# Patient Record
Sex: Male | Born: 1960 | Race: Black or African American | Hispanic: No | Marital: Married | Smoking: Current every day smoker
Health system: Southern US, Community
[De-identification: ages and names within clinical notes are randomized; demographics above are authoritative.]

## PROBLEM LIST (undated history)

## (undated) DIAGNOSIS — F329 Major depressive disorder, single episode, unspecified: Secondary | ICD-10-CM

## (undated) DIAGNOSIS — F419 Anxiety disorder, unspecified: Secondary | ICD-10-CM

---

## 1980-04-21 HISTORY — PX: TONSILLECTOMY: SUR1361

## 2002-04-21 DIAGNOSIS — F32A Depression, unspecified: Secondary | ICD-10-CM

## 2002-04-21 DIAGNOSIS — F419 Anxiety disorder, unspecified: Secondary | ICD-10-CM

## 2002-04-21 HISTORY — DX: Depression, unspecified: F32.A

## 2002-04-21 HISTORY — DX: Anxiety disorder, unspecified: F41.9

## 2017-05-18 ENCOUNTER — Emergency Department (HOSPITAL_COMMUNITY): Payer: No Typology Code available for payment source

## 2017-05-18 ENCOUNTER — Other Ambulatory Visit: Payer: Self-pay

## 2017-05-18 ENCOUNTER — Encounter (HOSPITAL_COMMUNITY): Payer: Self-pay

## 2017-05-18 ENCOUNTER — Inpatient Hospital Stay (HOSPITAL_COMMUNITY)
Admission: EM | Admit: 2017-05-18 | Discharge: 2017-05-24 | DRG: 199 | Disposition: A | Discharge status: Home / Self Care | Payer: No Typology Code available for payment source | Attending: General Surgery | Admitting: General Surgery

## 2017-05-18 DIAGNOSIS — I959 Hypotension, unspecified: Secondary | ICD-10-CM | POA: Diagnosis present

## 2017-05-18 DIAGNOSIS — W260XXA Contact with knife, initial encounter: Secondary | ICD-10-CM | POA: Diagnosis present

## 2017-05-18 DIAGNOSIS — W458XXA Other foreign body or object entering through skin, initial encounter: Secondary | ICD-10-CM | POA: Diagnosis present

## 2017-05-18 DIAGNOSIS — S21312A Laceration without foreign body of left front wall of thorax with penetration into thoracic cavity, initial encounter: Secondary | ICD-10-CM | POA: Diagnosis present

## 2017-05-18 DIAGNOSIS — S21112A Laceration without foreign body of left front wall of thorax without penetration into thoracic cavity, initial encounter: Secondary | ICD-10-CM

## 2017-05-18 DIAGNOSIS — Z4682 Encounter for fitting and adjustment of non-vascular catheter: Secondary | ICD-10-CM

## 2017-05-18 DIAGNOSIS — F1410 Cocaine abuse, uncomplicated: Secondary | ICD-10-CM | POA: Diagnosis present

## 2017-05-18 DIAGNOSIS — J939 Pneumothorax, unspecified: Secondary | ICD-10-CM

## 2017-05-18 DIAGNOSIS — S270XXA Traumatic pneumothorax, initial encounter: Principal | ICD-10-CM | POA: Diagnosis present

## 2017-05-18 DIAGNOSIS — S27331A Laceration of lung, unilateral, initial encounter: Secondary | ICD-10-CM | POA: Diagnosis present

## 2017-05-18 DIAGNOSIS — F17200 Nicotine dependence, unspecified, uncomplicated: Secondary | ICD-10-CM | POA: Diagnosis present

## 2017-05-18 DIAGNOSIS — Z9689 Presence of other specified functional implants: Secondary | ICD-10-CM

## 2017-05-18 DIAGNOSIS — R079 Chest pain, unspecified: Secondary | ICD-10-CM

## 2017-05-18 HISTORY — DX: Anxiety disorder, unspecified: F41.9

## 2017-05-18 HISTORY — DX: Major depressive disorder, single episode, unspecified: F32.9

## 2017-05-18 LAB — CBC WITH DIFFERENTIAL/PLATELET
BASOS PCT: 0 %
Basophils Absolute: 0 10*3/uL (ref 0.0–0.1)
EOS ABS: 0.1 10*3/uL (ref 0.0–0.7)
Eosinophils Relative: 1 %
HCT: 45.7 % (ref 39.0–52.0)
HEMOGLOBIN: 14.5 g/dL (ref 13.0–17.0)
Lymphocytes Relative: 46 %
Lymphs Abs: 5 10*3/uL — ABNORMAL HIGH (ref 0.7–4.0)
MCH: 29.1 pg (ref 26.0–34.0)
MCHC: 31.7 g/dL (ref 30.0–36.0)
MCV: 91.8 fL (ref 78.0–100.0)
MONOS PCT: 8 %
Monocytes Absolute: 0.9 10*3/uL (ref 0.1–1.0)
NEUTROS PCT: 45 %
Neutro Abs: 5 10*3/uL (ref 1.7–7.7)
PLATELETS: 294 10*3/uL (ref 150–400)
RBC: 4.98 MIL/uL (ref 4.22–5.81)
RDW: 14.3 % (ref 11.5–15.5)
WBC: 11 10*3/uL — AB (ref 4.0–10.5)

## 2017-05-18 LAB — TYPE AND SCREEN
ABO/RH(D): O POS
Antibody Screen: NEGATIVE
UNIT DIVISION: 0
Unit division: 0

## 2017-05-18 LAB — PREPARE FRESH FROZEN PLASMA
UNIT DIVISION: 0
Unit division: 0

## 2017-05-18 LAB — COMPREHENSIVE METABOLIC PANEL
ALK PHOS: 56 U/L (ref 38–126)
ALT: 17 U/L (ref 17–63)
AST: 58 U/L — ABNORMAL HIGH (ref 15–41)
Albumin: 4.4 g/dL (ref 3.5–5.0)
BUN: 14 mg/dL (ref 6–20)
CALCIUM: 9.6 mg/dL (ref 8.9–10.3)
CO2: <7 mmol/L — ABNORMAL LOW (ref 22–32)
Chloride: 99 mmol/L — ABNORMAL LOW (ref 101–111)
Creatinine, Ser: 1.87 mg/dL — ABNORMAL HIGH (ref 0.61–1.24)
GFR calc non Af Amer: 38 mL/min — ABNORMAL LOW (ref >60)
GFR, EST AFRICAN AMERICAN: 44 mL/min — AB (ref >60)
Glucose, Bld: 205 mg/dL — ABNORMAL HIGH (ref 65–99)
Potassium: 3.7 mmol/L (ref 3.5–5.1)
Sodium: 139 mmol/L (ref 135–145)
TOTAL PROTEIN: 7.9 g/dL (ref 6.5–8.1)
Total Bilirubin: 1.2 mg/dL (ref 0.3–1.2)

## 2017-05-18 LAB — BPAM FFP
BLOOD PRODUCT EXPIRATION DATE: 201902172359
BLOOD PRODUCT EXPIRATION DATE: 201902172359
ISSUE DATE / TIME: 201901281056
ISSUE DATE / TIME: 201901281056
UNIT TYPE AND RH: 6200
Unit Type and Rh: 6200

## 2017-05-18 LAB — URINALYSIS, ROUTINE W REFLEX MICROSCOPIC
Bacteria, UA: NONE SEEN
Bilirubin Urine: NEGATIVE
GLUCOSE, UA: 50 mg/dL — AB
Ketones, ur: 5 mg/dL — AB
LEUKOCYTES UA: NEGATIVE
NITRITE: NEGATIVE
PH: 6 (ref 5.0–8.0)
Protein, ur: NEGATIVE mg/dL
SQUAMOUS EPITHELIAL / LPF: NONE SEEN
WBC, UA: NONE SEEN WBC/hpf (ref 0–5)

## 2017-05-18 LAB — MRSA PCR SCREENING: MRSA BY PCR: NEGATIVE

## 2017-05-18 LAB — I-STAT CHEM 8, ED
BUN: 15 mg/dL (ref 6–20)
CALCIUM ION: 1.09 mmol/L — AB (ref 1.15–1.40)
CHLORIDE: 103 mmol/L (ref 101–111)
Creatinine, Ser: 1.3 mg/dL — ABNORMAL HIGH (ref 0.61–1.24)
Glucose, Bld: 188 mg/dL — ABNORMAL HIGH (ref 65–99)
HCT: 47 % (ref 39.0–52.0)
Hemoglobin: 16 g/dL (ref 13.0–17.0)
Potassium: 3.7 mmol/L (ref 3.5–5.1)
Sodium: 140 mmol/L (ref 135–145)
TCO2: 9 mmol/L — ABNORMAL LOW (ref 22–32)

## 2017-05-18 LAB — RAPID URINE DRUG SCREEN, HOSP PERFORMED
AMPHETAMINES: NOT DETECTED
BARBITURATES: NOT DETECTED
BENZODIAZEPINES: NOT DETECTED
Cocaine: POSITIVE — AB
Opiates: POSITIVE — AB
Tetrahydrocannabinol: NOT DETECTED

## 2017-05-18 LAB — I-STAT TROPONIN, ED: Troponin i, poc: 0.01 ng/mL (ref 0.00–0.08)

## 2017-05-18 LAB — ETHANOL: Alcohol, Ethyl (B): <10 mg/dL (ref <10)

## 2017-05-18 LAB — I-STAT VENOUS BLOOD GAS, ED
Bicarbonate: 27.5 mmol/L (ref 20.0–28.0)
O2 Saturation: 68 %
PCO2 VEN: 54.8 mmHg (ref 44.0–60.0)
PH VEN: 7.308 (ref 7.250–7.430)
TCO2: 29 mmol/L (ref 22–32)
pO2, Ven: 39 mmHg (ref 32.0–45.0)

## 2017-05-18 LAB — BPAM RBC
BLOOD PRODUCT EXPIRATION DATE: 201902012359
Blood Product Expiration Date: 201902032359
ISSUE DATE / TIME: 201901281055
ISSUE DATE / TIME: 201901281055
UNIT TYPE AND RH: 9500
Unit Type and Rh: 9500

## 2017-05-18 LAB — ABO/RH: ABO/RH(D): O POS

## 2017-05-18 MED ORDER — ONDANSETRON HCL 4 MG/2ML IJ SOLN: 4.0000 mg | Freq: Four times a day (QID) | INTRAMUSCULAR | Status: DC | PRN

## 2017-05-18 MED ORDER — FENTANYL CITRATE (PF) 100 MCG/2ML IJ SOLN
INTRAMUSCULAR | Status: AC
  Filled 2017-05-18: qty 2

## 2017-05-18 MED ORDER — IOPAMIDOL (ISOVUE-370) INJECTION 76%
INTRAVENOUS | Status: AC
  Filled 2017-05-18: qty 100

## 2017-05-18 MED ORDER — VITAMIN B-1 100 MG PO TABS
100.0000 mg | ORAL_TABLET | Freq: Every day | ORAL | Status: DC
  Administered 2017-05-18 – 2017-05-23 (×6): 100 mg via ORAL
  Filled 2017-05-18 (×6): qty 1

## 2017-05-18 MED ORDER — METHOCARBAMOL 1000 MG/10ML IJ SOLN
500.0000 mg | Freq: Three times a day (TID) | INTRAVENOUS | Status: DC
  Administered 2017-05-18 – 2017-05-21 (×9): 500 mg via INTRAVENOUS
  Filled 2017-05-18 (×10): qty 5
  Filled 2017-05-18 (×3): qty 550

## 2017-05-18 MED ORDER — FOLIC ACID 1 MG PO TABS
1.0000 mg | ORAL_TABLET | Freq: Every day | ORAL | Status: DC
  Administered 2017-05-18 – 2017-05-23 (×6): 1 mg via ORAL
  Filled 2017-05-18 (×6): qty 1

## 2017-05-18 MED ORDER — HYDROMORPHONE HCL 1 MG/ML IJ SOLN
0.5000 mg | INTRAMUSCULAR | Status: DC | PRN
  Administered 2017-05-18 – 2017-05-19 (×4): 1 mg via INTRAVENOUS
  Administered 2017-05-19: 0.5 mg via INTRAVENOUS
  Administered 2017-05-19 – 2017-05-21 (×12): 1 mg via INTRAVENOUS
  Filled 2017-05-18 (×16): qty 1
  Filled 2017-05-18: qty 0.5
  Filled 2017-05-18: qty 1

## 2017-05-18 MED ORDER — SODIUM CHLORIDE 0.9 % IV SOLN
INTRAVENOUS | Status: AC | PRN
  Administered 2017-05-18: 1000 mL via INTRAVENOUS

## 2017-05-18 MED ORDER — ADULT MULTIVITAMIN W/MINERALS CH
1.0000 | ORAL_TABLET | Freq: Every day | ORAL | Status: DC
  Administered 2017-05-18 – 2017-05-23 (×6): 1 via ORAL
  Filled 2017-05-18 (×6): qty 1

## 2017-05-18 MED ORDER — PANTOPRAZOLE SODIUM 40 MG PO TBEC
40.0000 mg | DELAYED_RELEASE_TABLET | Freq: Every day | ORAL | Status: DC
  Administered 2017-05-18 – 2017-05-23 (×6): 40 mg via ORAL
  Filled 2017-05-18 (×6): qty 1

## 2017-05-18 MED ORDER — TETANUS-DIPHTH-ACELL PERTUSSIS 5-2.5-18.5 LF-MCG/0.5 IM SUSP
0.5000 mL | Freq: Once | INTRAMUSCULAR | Status: AC
  Administered 2017-05-18: 0.5 mL via INTRAMUSCULAR
  Filled 2017-05-18: qty 0.5

## 2017-05-18 MED ORDER — KCL IN DEXTROSE-NACL 20-5-0.45 MEQ/L-%-% IV SOLN
INTRAVENOUS | Status: DC
  Administered 2017-05-18 – 2017-05-21 (×5): via INTRAVENOUS
  Filled 2017-05-18 (×6): qty 1000

## 2017-05-18 MED ORDER — LORAZEPAM 1 MG PO TABS: 1.0000 mg | ORAL_TABLET | Freq: Four times a day (QID) | ORAL | Status: AC | PRN

## 2017-05-18 MED ORDER — LORAZEPAM 2 MG/ML IJ SOLN
0.0000 mg | Freq: Four times a day (QID) | INTRAMUSCULAR | Status: DC
  Administered 2017-05-19: 2 mg via INTRAVENOUS
  Filled 2017-05-18: qty 1

## 2017-05-18 MED ORDER — LORAZEPAM 2 MG/ML IJ SOLN: 1.0000 mg | Freq: Four times a day (QID) | INTRAMUSCULAR | Status: AC | PRN

## 2017-05-18 MED ORDER — ONDANSETRON 4 MG PO TBDP: 4.0000 mg | ORAL_TABLET | Freq: Four times a day (QID) | ORAL | Status: DC | PRN

## 2017-05-18 MED ORDER — PANTOPRAZOLE SODIUM 40 MG IV SOLR: 40.0000 mg | Freq: Every day | INTRAVENOUS | Status: DC

## 2017-05-18 MED ORDER — KETOROLAC TROMETHAMINE 30 MG/ML IJ SOLN
30.0000 mg | Freq: Once | INTRAMUSCULAR | Status: AC
  Administered 2017-05-18: 30 mg via INTRAVENOUS
  Filled 2017-05-18: qty 1

## 2017-05-18 MED ORDER — HYDROMORPHONE HCL 1 MG/ML IJ SOLN
0.5000 mg | INTRAMUSCULAR | Status: DC | PRN
  Administered 2017-05-18: 1 mg via INTRAVENOUS
  Filled 2017-05-18: qty 1

## 2017-05-18 MED ORDER — ENOXAPARIN SODIUM 40 MG/0.4ML ~~LOC~~ SOLN
40.0000 mg | SUBCUTANEOUS | Status: DC
  Administered 2017-05-19 – 2017-05-21 (×3): 40 mg via SUBCUTANEOUS
  Filled 2017-05-18 (×5): qty 0.4

## 2017-05-18 MED ORDER — THIAMINE HCL 100 MG/ML IJ SOLN: 100.0000 mg | Freq: Every day | INTRAMUSCULAR | Status: DC

## 2017-05-18 MED ORDER — LORAZEPAM 2 MG/ML IJ SOLN: 0.0000 mg | Freq: Two times a day (BID) | INTRAMUSCULAR | Status: DC

## 2017-05-18 MED ORDER — FENTANYL CITRATE (PF) 100 MCG/2ML IJ SOLN
INTRAMUSCULAR | Status: AC | PRN
  Administered 2017-05-18: 50 ug via INTRAVENOUS

## 2017-05-18 MED ORDER — IPRATROPIUM-ALBUTEROL 0.5-2.5 (3) MG/3ML IN SOLN: 3.0000 mL | RESPIRATORY_TRACT | Status: DC | PRN

## 2017-05-18 MED ORDER — ACETAMINOPHEN 500 MG PO TABS
1000.0000 mg | ORAL_TABLET | Freq: Three times a day (TID) | ORAL | Status: DC
  Administered 2017-05-18 – 2017-05-24 (×19): 1000 mg via ORAL
  Filled 2017-05-18 (×20): qty 2

## 2017-05-18 MED ORDER — HYDRALAZINE HCL 20 MG/ML IJ SOLN: 10.0000 mg | INTRAMUSCULAR | Status: DC | PRN

## 2017-05-18 MED ORDER — OXYCODONE HCL 5 MG PO TABS
5.0000 mg | ORAL_TABLET | ORAL | Status: DC | PRN
  Administered 2017-05-18: 5 mg via ORAL
  Administered 2017-05-19 – 2017-05-20 (×5): 10 mg via ORAL
  Filled 2017-05-18: qty 1
  Filled 2017-05-18 (×5): qty 2

## 2017-05-18 MED ORDER — DOCUSATE SODIUM 100 MG PO CAPS
100.0000 mg | ORAL_CAPSULE | Freq: Two times a day (BID) | ORAL | Status: DC
  Administered 2017-05-18 – 2017-05-23 (×10): 100 mg via ORAL
  Filled 2017-05-18 (×10): qty 1

## 2017-05-18 NOTE — ED Notes (Signed)
PA at the bedside.

## 2017-05-18 NOTE — Progress Notes (Signed)
Visited with this patient as a referral from previous Chaplain.  Patient is crying and talking about being tired of certain situations he faces in life.  Patient asked for prayer.  Compassionate listening and presence provided.  Patient and I prayed together.  Will continue to follow as needed.  Please place a consult or page chaplain as needed.    05/18/17 1242  Clinical Encounter Type  Visited With Patient;Health care provider  Visit Type Follow-up;Psychological support;Spiritual support;Trauma  Spiritual Encounters  Spiritual Needs Prayer;Emotional  Stress Factors  Patient Stress Factors Exhausted
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
## 2017-05-18 NOTE — ED Notes (Signed)
MD Janee Mornhompson at the bedside placing Chest Tube

## 2017-05-18 NOTE — Progress Notes (Signed)
1555 Pt to floor via stretcher. A&Ox4, moaning in pain. Chest tube to left anterior chest, draining bloody fluid. Pt assessed, oriented to room, VSS. Dr. Lindie SpruceWyatt at bedside, new orders received for pain meds. See MAR. WCTM.   1800 Pt resting comfortably, NAD, VSS, updated mom via phone. Password set up. WCTM.
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
## 2017-05-18 NOTE — Procedures (Signed)
Chest Tube Insertion Procedure Note  Indications:  L pneumothorax  Pre-operative Diagnosis: L Pneumothorax  Post-operative Diagnosis: L Pneumothorax   Surgeon: Violeta GelinasBurke Anessia Oakland, MD  Assist: Raphael GibneyZach Shultz  Procedure Details  Informed consent was obtained for the procedure, including sedation.  Risks of lung perforation, hemorrhage, arrhythmia, and adverse drug reaction were discussed.   After sterile skin prep, using standard technique, a 14 French tube was placed in the left anterior axillary line above the nipple level using Seldinger technique. Tube was attached to a Pleurevaqc. Starile dressing placed.  Findings: Rush of air  Estimated Blood Loss:  Minimal         Specimens:  None              Complications:  None; patient tolerated the procedure well.         Disposition: trauma bay         Condition: stable  Violeta GelinasBurke Modene Andy, MD, MPH, FACS Trauma: [phone] General Surgery: [phone]
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
## 2017-05-18 NOTE — ED Notes (Signed)
Pt taken to CT.
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
## 2017-05-19 ENCOUNTER — Inpatient Hospital Stay (HOSPITAL_COMMUNITY): Payer: No Typology Code available for payment source

## 2017-05-19 LAB — BASIC METABOLIC PANEL
ANION GAP: 9 (ref 5–15)
BUN: 9 mg/dL (ref 6–20)
CALCIUM: 8.9 mg/dL (ref 8.9–10.3)
CO2: 26 mmol/L (ref 22–32)
Chloride: 104 mmol/L (ref 101–111)
Creatinine, Ser: 1 mg/dL (ref 0.61–1.24)
GFR calc Af Amer: >60 mL/min (ref >60)
GFR calc non Af Amer: >60 mL/min (ref >60)
GLUCOSE: 113 mg/dL — AB (ref 65–99)
Potassium: 3.6 mmol/L (ref 3.5–5.1)
Sodium: 139 mmol/L (ref 135–145)

## 2017-05-19 LAB — CBC
HEMATOCRIT: 38.5 % — AB (ref 39.0–52.0)
Hemoglobin: 12.6 g/dL — ABNORMAL LOW (ref 13.0–17.0)
MCH: 28.3 pg (ref 26.0–34.0)
MCHC: 32.7 g/dL (ref 30.0–36.0)
MCV: 86.5 fL (ref 78.0–100.0)
Platelets: 200 10*3/uL (ref 150–400)
RBC: 4.45 MIL/uL (ref 4.22–5.81)
RDW: 14.4 % (ref 11.5–15.5)
WBC: 5.7 10*3/uL (ref 4.0–10.5)

## 2017-05-19 LAB — BLOOD PRODUCT ORDER (VERBAL) VERIFICATION
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
## 2017-05-19 NOTE — Progress Notes (Signed)
Patient arrived to 6n31 slightly drowsy but oriented. IV fluids infusing, scds on, placed on cont pulse ox and telemetry. Patient has chest tube that is hooked to wall suction. Oriented to room and staff. Will continue to monitor.

## 2017-05-20 ENCOUNTER — Inpatient Hospital Stay (HOSPITAL_COMMUNITY): Payer: No Typology Code available for payment source

## 2017-05-20 LAB — CBC WITH DIFFERENTIAL/PLATELET
BASOS ABS: 0 10*3/uL (ref 0.0–0.1)
Basophils Relative: 0 %
EOS ABS: 0.1 10*3/uL (ref 0.0–0.7)
EOS PCT: 2 %
HCT: 38.4 % — ABNORMAL LOW (ref 39.0–52.0)
Hemoglobin: 12.3 g/dL — ABNORMAL LOW (ref 13.0–17.0)
LYMPHS PCT: 26 %
Lymphs Abs: 1.4 10*3/uL (ref 0.7–4.0)
MCH: 27.8 pg (ref 26.0–34.0)
MCHC: 32 g/dL (ref 30.0–36.0)
MCV: 86.9 fL (ref 78.0–100.0)
Monocytes Absolute: 0.4 10*3/uL (ref 0.1–1.0)
Monocytes Relative: 7 %
Neutro Abs: 3.5 10*3/uL (ref 1.7–7.7)
Neutrophils Relative %: 65 %
PLATELETS: 172 10*3/uL (ref 150–400)
RBC: 4.42 MIL/uL (ref 4.22–5.81)
RDW: 14.3 % (ref 11.5–15.5)
WBC: 5.5 10*3/uL (ref 4.0–10.5)

## 2017-05-20 MED ORDER — OXYCODONE HCL 5 MG PO TABS
5.0000 mg | ORAL_TABLET | ORAL | Status: DC | PRN
  Administered 2017-05-20 – 2017-05-22 (×15): 10 mg via ORAL
  Administered 2017-05-23: 5 mg via ORAL
  Administered 2017-05-23: 10 mg via ORAL
  Administered 2017-05-23: 5 mg via ORAL
  Administered 2017-05-23 – 2017-05-24 (×7): 10 mg via ORAL
  Filled 2017-05-20 (×25): qty 2
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
## 2017-05-20 NOTE — Progress Notes (Signed)
PT Cancellation Note  Patient Details Name: Kent BargesWillie Q XXXJefferson Jr. MRN: [id number] DOB: 1961-01-01   Cancelled Treatment:    Reason Eval/Treat Not Completed: Other (comment) Pt currently eating lunch and requesting PT to come back later. Will reattempt as schedule allows.   Gladys DammeBrittany Deylan Canterbury, PT, DPT  Acute Rehabilitation Services  Pager: [phone]  Kent PromBrittany S Falana Clagg 05/20/2017, 1:36 PM
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
## 2017-05-21 ENCOUNTER — Inpatient Hospital Stay (HOSPITAL_COMMUNITY): Payer: No Typology Code available for payment source

## 2017-05-21 MED ORDER — BISACODYL 10 MG RE SUPP: 10.0000 mg | Freq: Every day | RECTAL | Status: DC | PRN

## 2017-05-21 MED ORDER — TRAMADOL HCL 50 MG PO TABS
50.0000 mg | ORAL_TABLET | Freq: Four times a day (QID) | ORAL | Status: DC
  Administered 2017-05-21 – 2017-05-22 (×4): 50 mg via ORAL
  Filled 2017-05-21 (×4): qty 1

## 2017-05-21 MED ORDER — METHOCARBAMOL 500 MG PO TABS
500.0000 mg | ORAL_TABLET | Freq: Three times a day (TID) | ORAL | Status: DC | PRN
  Administered 2017-05-22 (×2): 500 mg via ORAL
  Filled 2017-05-21 (×5): qty 1

## 2017-05-21 MED ORDER — POLYETHYLENE GLYCOL 3350 17 G PO PACK
17.0000 g | PACK | Freq: Every day | ORAL | Status: DC
  Administered 2017-05-21: 17 g via ORAL
  Filled 2017-05-21 (×3): qty 1

## 2017-05-21 NOTE — Progress Notes (Signed)
PT Cancellation Note  Patient Details Name: Kent BargesWillie Q XXXJefferson Jr. MRN: [id number] DOB: [birth year]   Cancelled Treatment:    Reason Eval/Treat Not Completed: Patient declined, no reason specified.  Pt refused, reporting he had been up once this AM.  I encouraged him to move again with me as the more he moved and walked, the better his lungs would do.  He refused reporting he was tired.  PT will check back in PM as time allowed.  Pt seemed reluctant even if PT would check back.    Thanks,    Rollene Rotundaebecca B. Vielka Klinedinst, PT, DPT [phone]   05/21/2017, 11:41 AM
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
## 2017-05-21 NOTE — Progress Notes (Signed)
SBIRT completed. Patient states he does not use alcohol. Patient endorsed using crack cocaine. CSW offered substance use treatment resources. Patient declined resources and [facility]. CSW signing off.  Abigail ButtsSusan Aja Whitehair, LCSWA [phone]
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
## 2017-05-22 ENCOUNTER — Inpatient Hospital Stay (HOSPITAL_COMMUNITY): Payer: No Typology Code available for payment source

## 2017-05-22 LAB — CBC
HCT: 40.1 % (ref 39.0–52.0)
Hemoglobin: 13 g/dL (ref 13.0–17.0)
MCH: 28.4 pg (ref 26.0–34.0)
MCHC: 32.4 g/dL (ref 30.0–36.0)
MCV: 87.7 fL (ref 78.0–100.0)
PLATELETS: 201 10*3/uL (ref 150–400)
RBC: 4.57 MIL/uL (ref 4.22–5.81)
RDW: 14 % (ref 11.5–15.5)
WBC: 3.9 10*3/uL — AB (ref 4.0–10.5)

## 2017-05-22 MED ORDER — TRAMADOL HCL 50 MG PO TABS
100.0000 mg | ORAL_TABLET | Freq: Four times a day (QID) | ORAL | Status: DC
  Administered 2017-05-22 – 2017-05-24 (×9): 100 mg via ORAL
  Filled 2017-05-22 (×9): qty 2

## 2017-05-22 NOTE — Progress Notes (Signed)
   05/22/17 1300  Clinical Encounter Type  Visited With Patient  Visit Type Social support  Referral From Nurse  Consult/Referral To Chaplain  Spiritual Encounters  Spiritual Needs Other (Comment)  Stress Factors  Patient Stress Factors Exhausted  Chaplain visited with the patient that stated he has already spoke to 2 Chaplains and he was okay.
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
## 2017-05-22 NOTE — Progress Notes (Signed)
Offered patient nicotine patch. Patient refused at this time.

## 2017-05-23 ENCOUNTER — Inpatient Hospital Stay (HOSPITAL_COMMUNITY): Payer: No Typology Code available for payment source
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
## 2017-05-24 ENCOUNTER — Inpatient Hospital Stay (HOSPITAL_COMMUNITY): Payer: No Typology Code available for payment source

## 2017-05-24 MED ORDER — HYDROCODONE-ACETAMINOPHEN 5-325 MG PO TABS: 1.0000 | ORAL_TABLET | Freq: Four times a day (QID) | ORAL | 0 refills | Status: AC | PRN

## 2017-05-24 NOTE — Progress Notes (Addendum)
Consulted by staff nurse to assess if patient has rights to appeal discharge. Patient does not have Medicare, therefore they do not have the rights as defined by medicare to file appeal for discharge through the [facility].  [facility]

## 2017-05-24 NOTE — Progress Notes (Signed)
Notified number on amion for social worker twice now. Awaiting call back to arrange transportation.

## 2017-05-24 NOTE — Progress Notes (Signed)
Patient stating his work note is wrong that he wants to return to work now, called on call MD Magnus IvanBlackman he said it was fine for him to go to work whenever he wants. Created new work note for patient.
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
## 2017-05-24 NOTE — Progress Notes (Signed)
Number on amion not correct for social worker,got a hold of correct social worker and let her know patient needed to be seen by social work due to transportation issue. She said she would be up there as soon as she could.
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
## 2017-06-01 ENCOUNTER — Other Ambulatory Visit: Payer: Self-pay | Admitting: Physician Assistant

## 2017-06-01 ENCOUNTER — Ambulatory Visit
Admission: RE | Admit: 2017-06-01 | Discharge: 2017-06-01 | Disposition: A | Discharge status: Home / Self Care | Payer: Non-veteran care | Source: Ambulatory Visit | Attending: Physician Assistant | Admitting: Physician Assistant

## 2017-06-01 DIAGNOSIS — J939 Pneumothorax, unspecified: Secondary | ICD-10-CM

## 2019-05-20 IMAGING — DX DG CHEST 1V PORT
1 series · 1 of 1 positions shown · non-contrast
Comparison: 05/18/2017 CT and chest x-ray.

CLINICAL DATA: 56-year-old male with pneumothorax post chest tube
insertion. Subsequent encounter.

EXAM:
PORTABLE CHEST 1 VIEW

[chest]
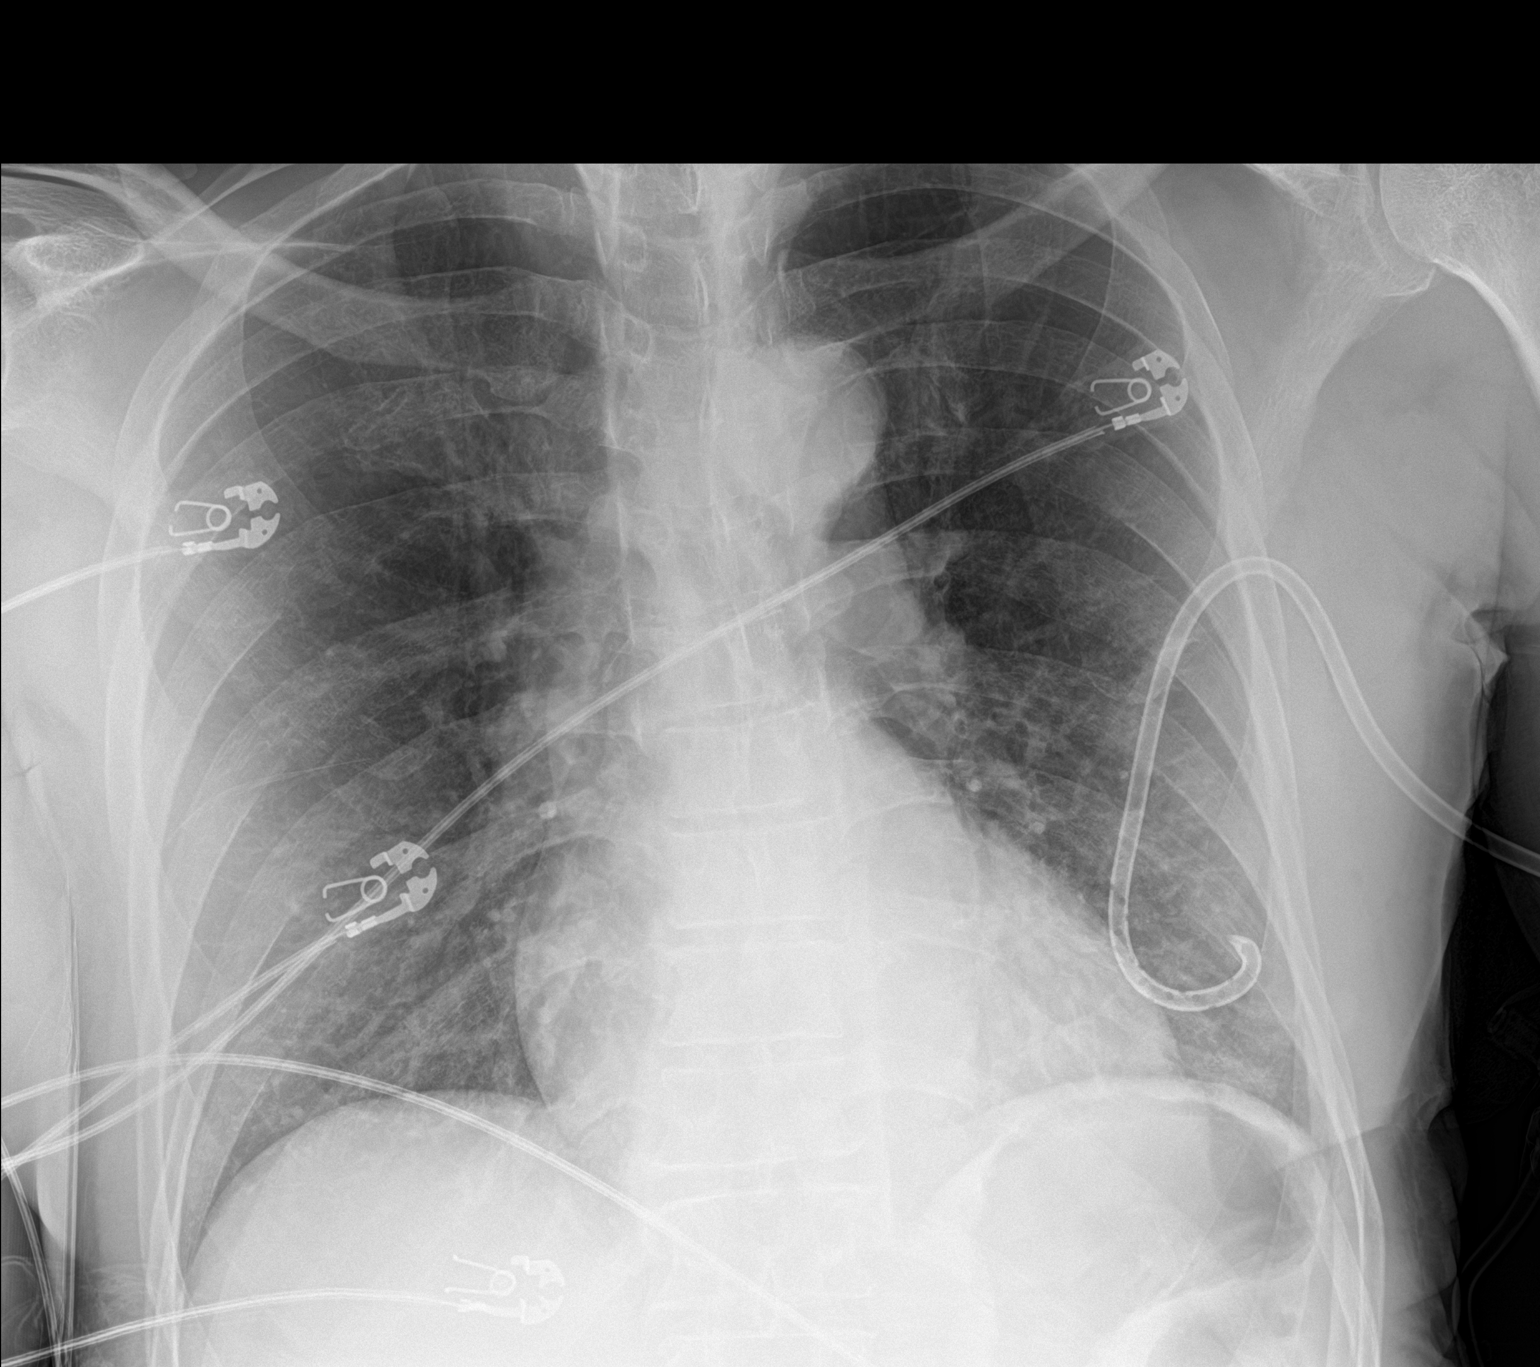

[1 of 1 positions shown; findings below may reference images not displayed]

FINDINGS: Left-sided chest tube has been placed with decrease in size of
left-sided pneumothorax with minimal left apical pneumothorax
remaining.

CT detected lingular contusion not well demonstrated on present
plain film exam.

Heart size within normal limits.
IMPRESSION: Left-sided chest tube has been placed with decrease in size of
left-sided pneumothorax with minimal left apical pneumothorax
remaining.

## 2019-05-21 IMAGING — CR DG CHEST 1V PORT
1 series · 1 of 1 positions shown · non-contrast
Comparison: 05/18/2017

CLINICAL DATA: Left pneumothorax. Chest tube. Shortness of breath.

EXAM:
PORTABLE CHEST 1 VIEW

[AP]
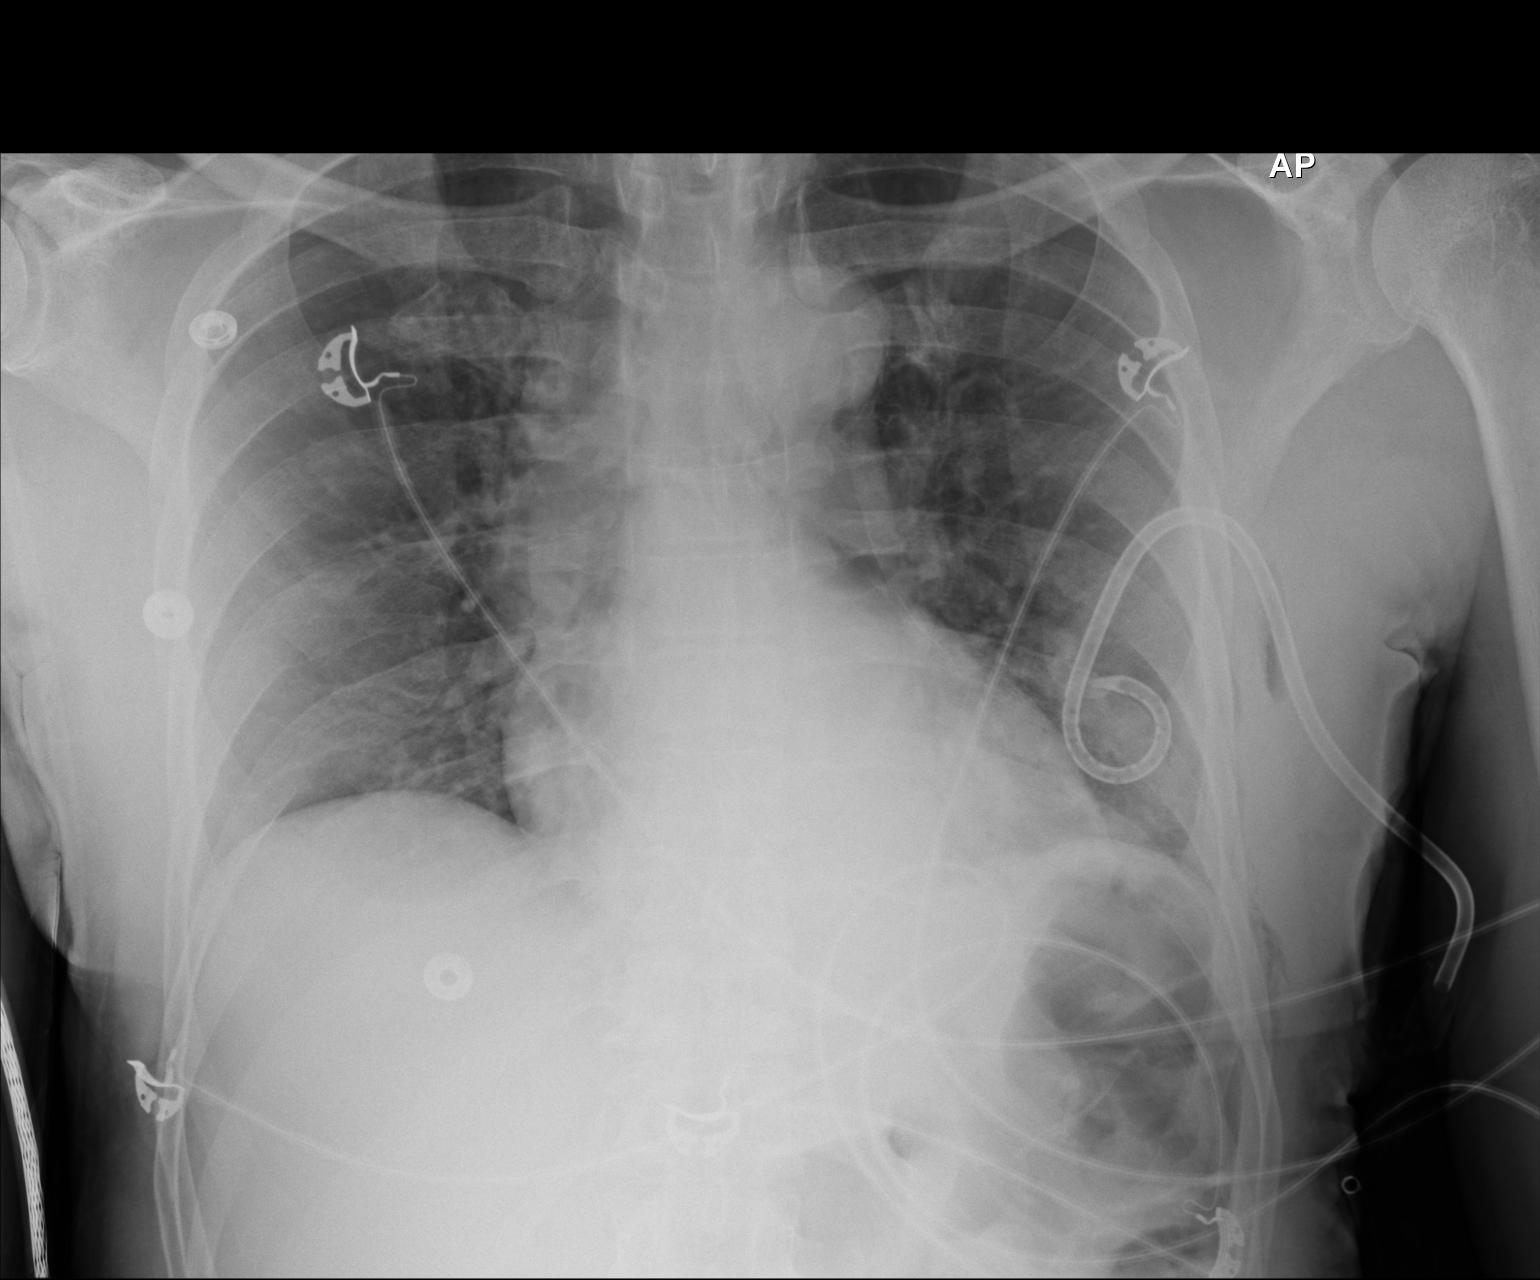

[1 of 1 positions shown; findings below may reference images not displayed]

FINDINGS: The cardiomediastinal silhouette is unchanged. A left chest tube
remains in place. Lung volumes are lower than on the prior study.
The tiny residual left apical pneumothorax on the prior study has
slightly increased in size. There is mild left basilar airspace
opacity. No sizable pleural effusion is identified. Mild soft tissue
emphysema in the left chest wall has increased.
IMPRESSION: 1. Minimally increased size of tiny left apical pneumothorax.
2. Shallower inspiration with mild left basilar opacity, likely
atelectasis.

## 2019-05-22 IMAGING — DX DG CHEST 1V PORT
1 series · 1 of 1 positions shown · non-contrast
Comparison: Chest x-rays from [DATE] and 05/18/2017

CLINICAL DATA: Followup pneumothorax.

EXAM:
PORTABLE CHEST 1 VIEW

[chest ap]
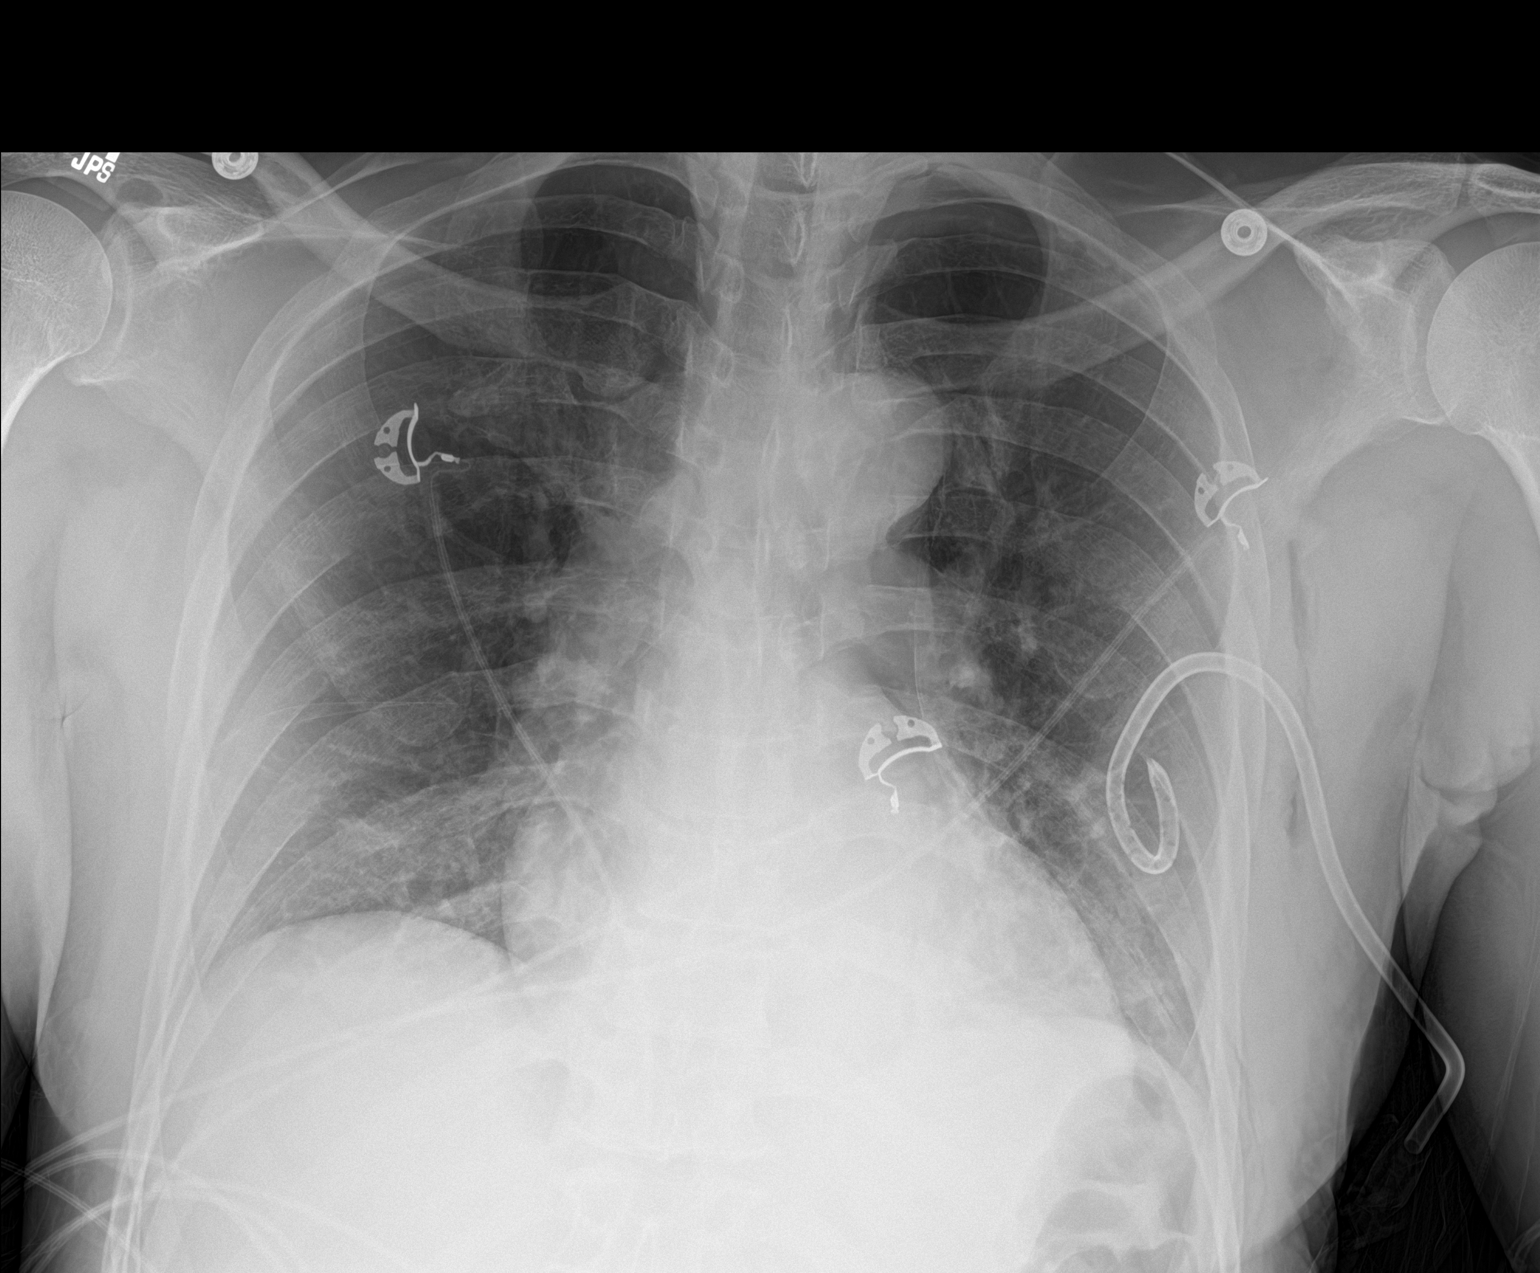

[1 of 1 positions shown; findings below may reference images not displayed]

FINDINGS: The left-sided chest tube is stable. There is a persistent small to
moderate size left-sided pneumothorax most notable medially. Low
lung volumes with bibasilar atelectasis, left greater than right.
IMPRESSION: Stable left-sided chest tube with persistent small to moderate-sized
pneumothorax.

## 2019-05-24 IMAGING — DX DG CHEST 1V PORT
1 series · 1 of 1 positions shown · non-contrast
Comparison: 05/21/2017

CLINICAL DATA: Pneumothorax with chest tube

EXAM:
PORTABLE CHEST 1 VIEW

[chest ap]
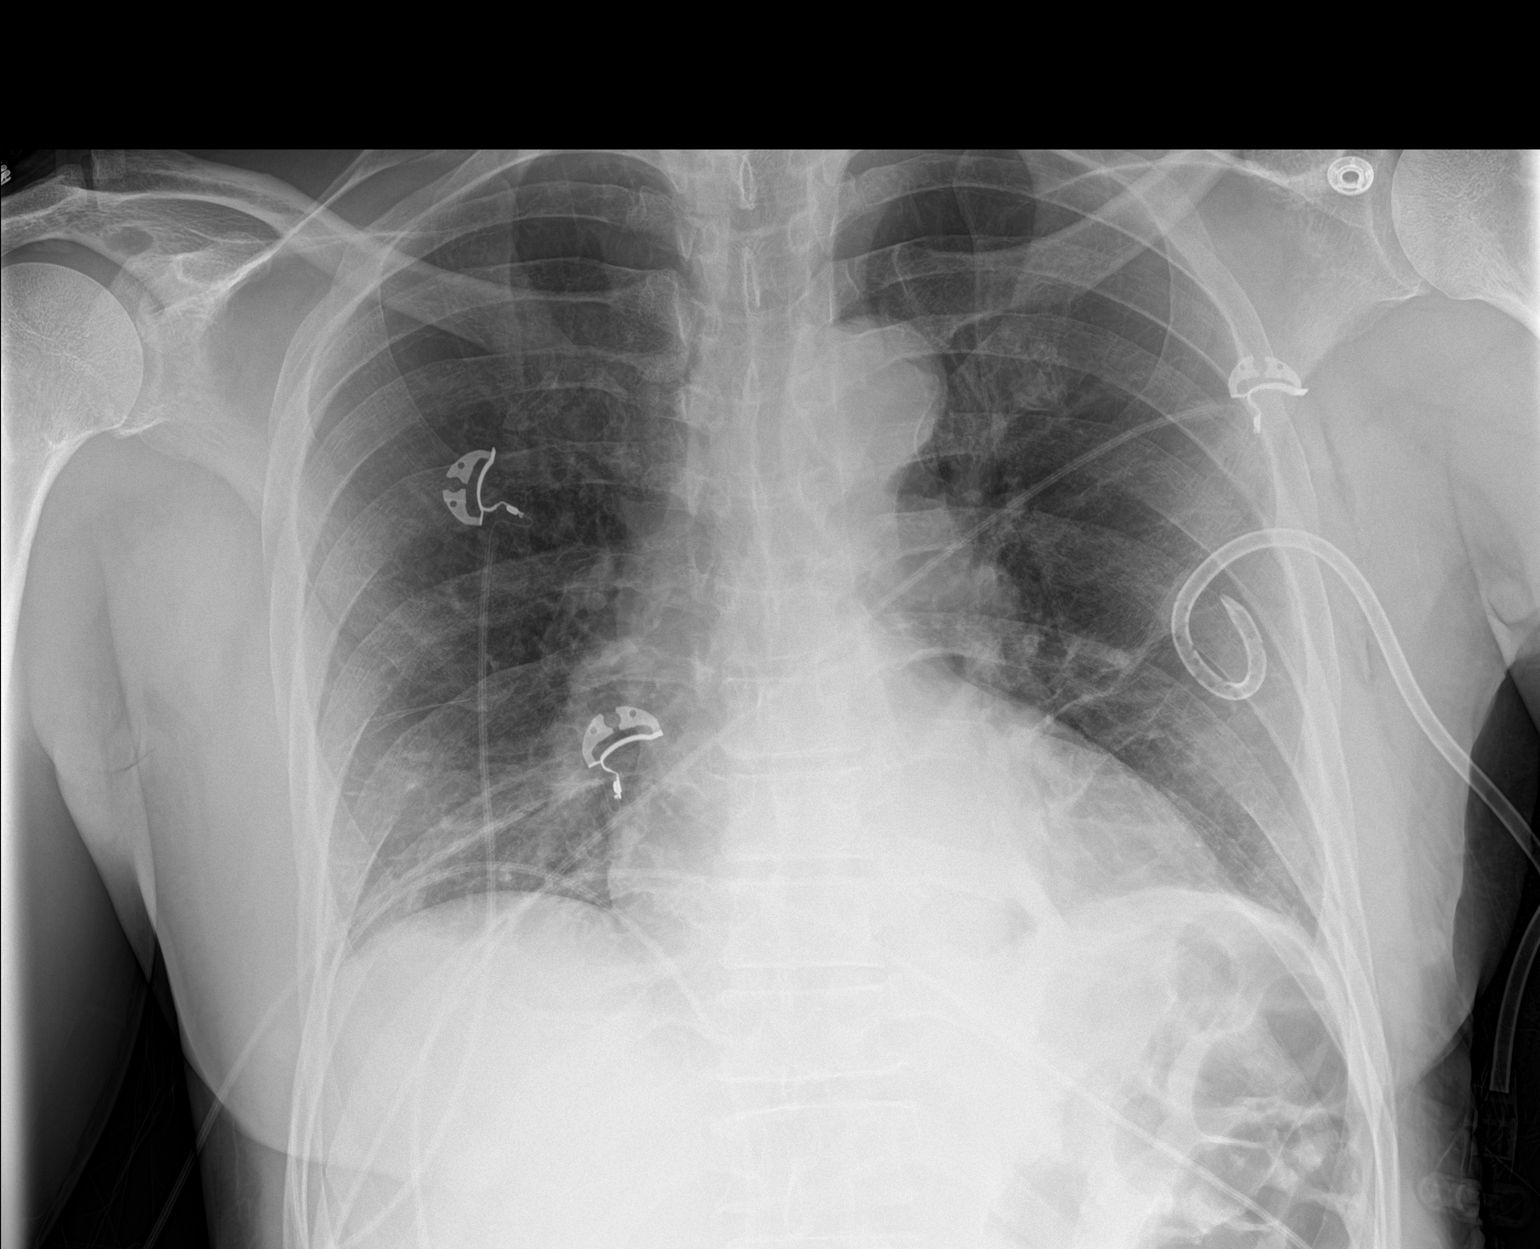

[1 of 1 positions shown; findings below may reference images not displayed]

FINDINGS: 1033 hours. Left chest tube remains in place. No evidence for
residual left pneumothorax. Basilar atelectasis bilaterally is
similar to prior. Cardiopericardial silhouette is at upper limits of
normal for size. The visualized bony structures of the thorax are
intact. Telemetry leads overlie the chest.
IMPRESSION: No substantial change. No pneumothorax with left chest tube in
place.

Left greater than right basilar atelectasis.

## 2019-05-25 IMAGING — DX DG CHEST 1V PORT
1 series · 1 of 1 positions shown · non-contrast
Comparison: 05/22/2017

CLINICAL DATA: Pneumothorax.

EXAM:
PORTABLE CHEST 1 VIEW

[chest]
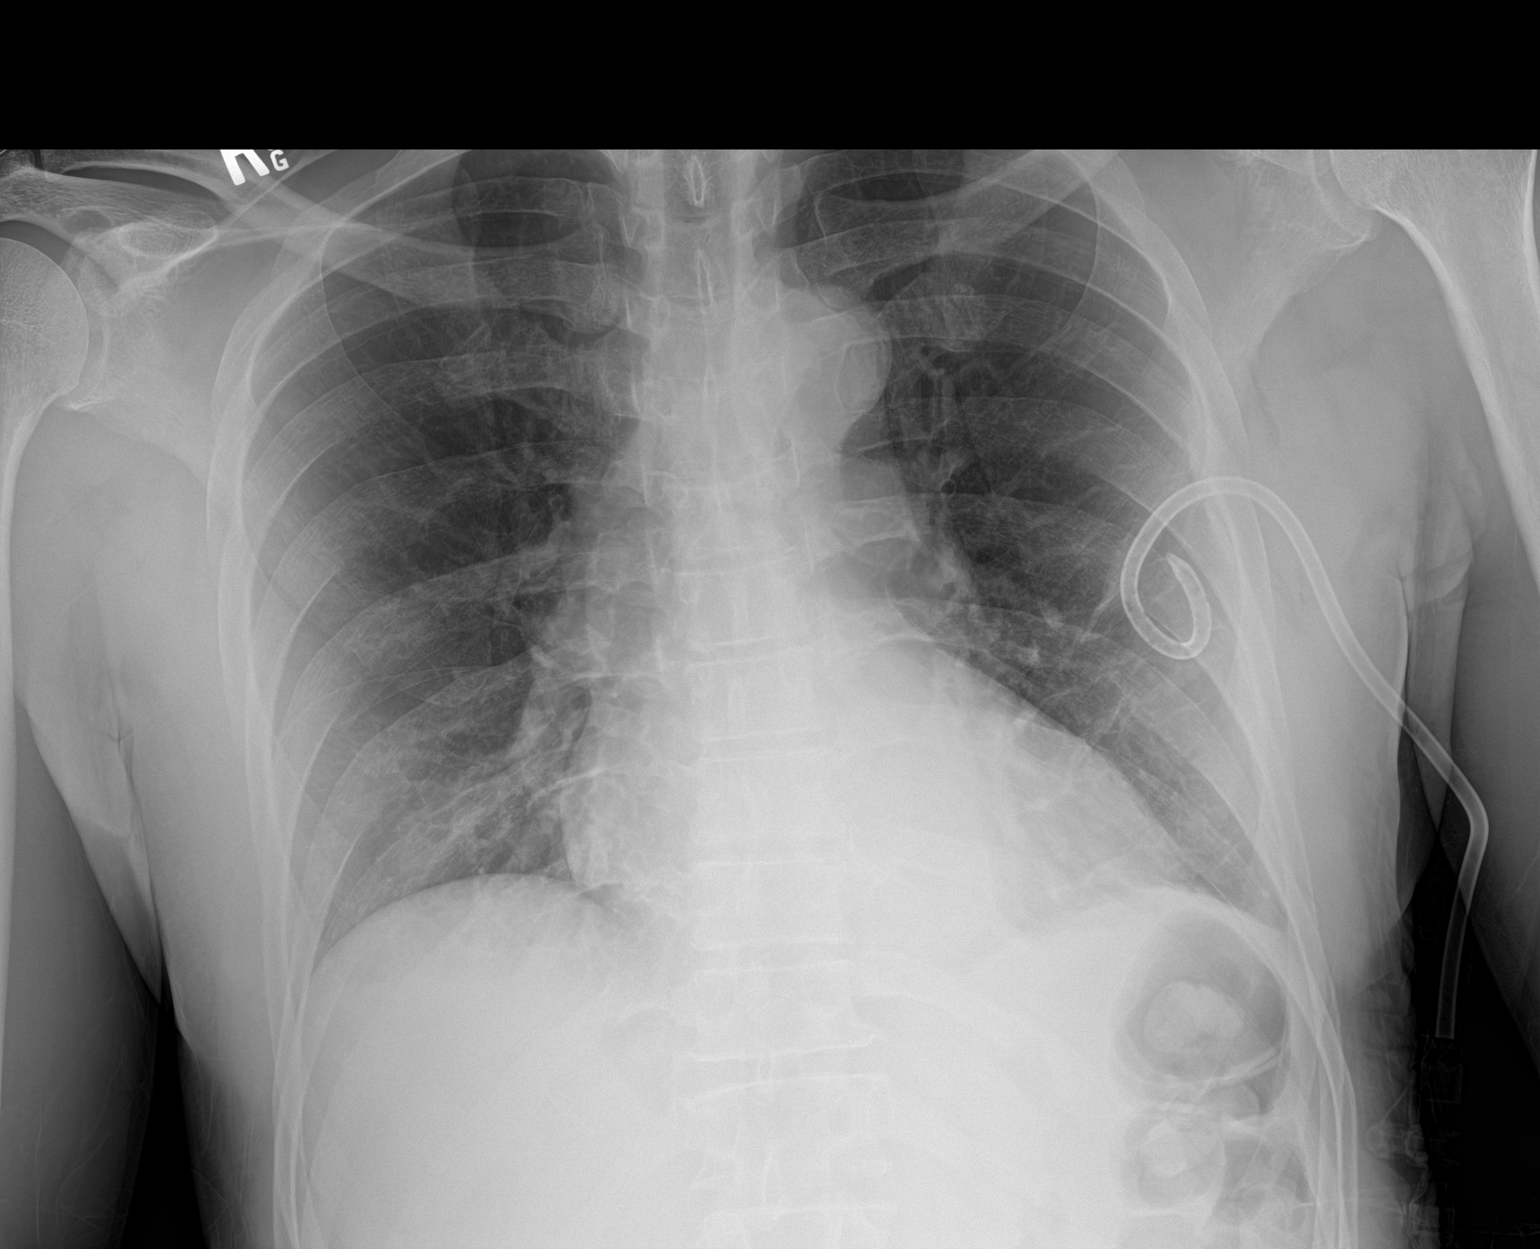

[1 of 1 positions shown; findings below may reference images not displayed]

FINDINGS: Left chest tube is unchanged. The cardiac silhouette is borderline
enlarged. No pneumothorax is identified. Mild left greater than
right basilar lung opacities are unchanged and likely represent
atelectasis. No sizable pleural effusion is identified.
IMPRESSION: Unchanged mild basilar atelectasis.  No pneumothorax.
